# Patient Record
Sex: Female | Born: 1955
Health system: Southern US, Community
[De-identification: ages and names within clinical notes are randomized; demographics above are authoritative.]

## PROBLEM LIST (undated history)

## (undated) DIAGNOSIS — I10 Essential (primary) hypertension: Secondary | ICD-10-CM

## (undated) HISTORY — DX: Essential (primary) hypertension: I10

## (undated) HISTORY — PX: NO PAST SURGERIES: SHX2092

---

## 2017-12-03 DIAGNOSIS — J309 Allergic rhinitis, unspecified: Secondary | ICD-10-CM | POA: Diagnosis not present

## 2017-12-03 DIAGNOSIS — R05 Cough: Secondary | ICD-10-CM | POA: Diagnosis not present

## 2017-12-03 DIAGNOSIS — J019 Acute sinusitis, unspecified: Secondary | ICD-10-CM | POA: Diagnosis not present

## 2017-12-03 DIAGNOSIS — I10 Essential (primary) hypertension: Secondary | ICD-10-CM | POA: Diagnosis not present

## 2018-04-01 DIAGNOSIS — G4700 Insomnia, unspecified: Secondary | ICD-10-CM | POA: Diagnosis not present

## 2018-04-01 DIAGNOSIS — I10 Essential (primary) hypertension: Secondary | ICD-10-CM | POA: Diagnosis not present

## 2018-04-01 DIAGNOSIS — R350 Frequency of micturition: Secondary | ICD-10-CM | POA: Diagnosis not present

## 2018-04-01 DIAGNOSIS — K219 Gastro-esophageal reflux disease without esophagitis: Secondary | ICD-10-CM | POA: Diagnosis not present

## 2018-05-02 ENCOUNTER — Ambulatory Visit (INDEPENDENT_AMBULATORY_CARE_PROVIDER_SITE_OTHER): Payer: BLUE CROSS/BLUE SHIELD

## 2018-05-02 ENCOUNTER — Ambulatory Visit (INDEPENDENT_AMBULATORY_CARE_PROVIDER_SITE_OTHER): Payer: BLUE CROSS/BLUE SHIELD | Admitting: Sports Medicine

## 2018-05-02 ENCOUNTER — Encounter: Payer: Self-pay | Admitting: Sports Medicine

## 2018-05-02 DIAGNOSIS — M1611 Unilateral primary osteoarthritis, right hip: Secondary | ICD-10-CM | POA: Insufficient documentation

## 2018-05-02 DIAGNOSIS — R0781 Pleurodynia: Secondary | ICD-10-CM

## 2018-05-02 DIAGNOSIS — S20212A Contusion of left front wall of thorax, initial encounter: Secondary | ICD-10-CM

## 2018-05-02 DIAGNOSIS — M533 Sacrococcygeal disorders, not elsewhere classified: Secondary | ICD-10-CM | POA: Diagnosis not present

## 2018-05-02 DIAGNOSIS — S322XXA Fracture of coccyx, initial encounter for closed fracture: Secondary | ICD-10-CM

## 2018-05-02 DIAGNOSIS — S299XXA Unspecified injury of thorax, initial encounter: Secondary | ICD-10-CM | POA: Diagnosis not present

## 2018-05-02 DIAGNOSIS — M25551 Pain in right hip: Secondary | ICD-10-CM

## 2018-05-02 DIAGNOSIS — S79911A Unspecified injury of right hip, initial encounter: Secondary | ICD-10-CM | POA: Diagnosis not present

## 2018-05-02 DIAGNOSIS — M170 Bilateral primary osteoarthritis of knee: Secondary | ICD-10-CM

## 2018-05-02 DIAGNOSIS — S8991XA Unspecified injury of right lower leg, initial encounter: Secondary | ICD-10-CM | POA: Diagnosis not present

## 2018-05-02 DIAGNOSIS — W19XXXA Unspecified fall, initial encounter: Secondary | ICD-10-CM

## 2018-05-02 DIAGNOSIS — S3992XA Unspecified injury of lower back, initial encounter: Secondary | ICD-10-CM | POA: Diagnosis not present

## 2018-05-02 DIAGNOSIS — S298XXA Other specified injuries of thorax, initial encounter: Secondary | ICD-10-CM | POA: Insufficient documentation

## 2018-05-02 DIAGNOSIS — M25561 Pain in right knee: Secondary | ICD-10-CM | POA: Diagnosis not present

## 2018-05-02 DIAGNOSIS — S8992XA Unspecified injury of left lower leg, initial encounter: Secondary | ICD-10-CM | POA: Diagnosis not present

## 2018-05-02 MED ORDER — ACETAMINOPHEN ER 650 MG PO TBCR: 650.0000 mg | EXTENDED_RELEASE_TABLET | Freq: Three times a day (TID) | ORAL | 3 refills | Status: AC | PRN

## 2018-05-02 MED ORDER — TRAMADOL HCL 50 MG PO TABS: 50.0000 mg | ORAL_TABLET | Freq: Three times a day (TID) | ORAL | 0 refills | Status: DC | PRN

## 2018-05-02 NOTE — Progress Notes (Signed)
Subjective:    I'm seeing this patient as a consultation for: Dr. Secundino Ginger  CC: Fall  HPI: This is a pleasant 62 year old female, 2 weeks ago she was in [place] visiting her son and daughter, unfortunately while getting up to use the bathroom night she took a fall, fell into the couch injuring the left side of her chest wall, as well as her tailbone.  [place] and told that she had a coccygeal fracture.  She also has pain in her right knee, right groin, no shortness of breath, no hemoptysis, only minimal pain with deep breathing.  I reviewed the past medical history, family history, social history, surgical history, and allergies today and no changes were needed.  Please see the problem list section below in epic for further details.  Past Medical History: Past Medical History:  Diagnosis Date  . Hypertension    Past Surgical History: History reviewed. No pertinent surgical history. Social History: Social History   Socioeconomic History  . Marital status: Married    Spouse name: Not on file  . Number of children: Not on file  . Years of education: Not on file  . Highest education level: Not on file  Occupational History  . Not on file  Social Needs  . Financial resource strain: Not on file  . Food insecurity:    Worry: Not on file    Inability: Not on file  . Transportation needs:    Medical: Not on file    Non-medical: Not on file  Tobacco Use  . Smoking status: Never Smoker  . Smokeless tobacco: Never Used  Substance and Sexual Activity  . Alcohol use: Never    Frequency: Never  . Drug use: Never  . Sexual activity: Not on file  Lifestyle  . Physical activity:    Days per week: Not on file    Minutes per session: Not on file  . Stress: Not on file  Relationships  . Social connections:    Talks on phone: Not on file    Gets together: Not on file    Attends religious service: Not on file    Active member of club  or organization: Not on file    Attends meetings of clubs or organizations: Not on file    Relationship status: Not on file  Other Topics Concern  . Not on file  Social History Narrative  . Not on file   Family History: No family history on file. Allergies: No Known Allergies Medications: See med rec.  Review of Systems: No headache, visual changes, nausea, vomiting, diarrhea, constipation, dizziness, abdominal pain, skin rash, fevers, chills, night sweats, weight loss, swollen lymph nodes, body aches, joint swelling, muscle aches, chest pain, shortness of breath, mood changes, visual or auditory hallucinations.   Objective:   General: Well Developed, well nourished, and in no acute distress.  Neuro:  Extra-ocular muscles intact, able to move all 4 extremities, sensation grossly intact.  Deep tendon reflexes tested were normal. Psych: Alert and oriented, mood congruent with affect. ENT:  Ears and nose appear unremarkable.  Hearing grossly normal. Neck: Unremarkable overall appearance, trachea midline.  No visible thyroid enlargement. Eyes: Conjunctivae and lids appear unremarkable.  Pupils equal and round. Skin: Warm and dry, no rashes noted.  Cardiovascular: Pulses palpable, no extremity edema. Musculoskeletal: Tender to palpation of the tip of the coccyx, pain with internal rotation of the right hip, tender to palpation on the  medial right knee, minimal tenderness over the posterior axillary line of the left rib cage.  X-rays personally reviewed, she does have a nondisplaced distal coccygeal fracture, severe bilateral right worse than left hip osteoarthritis, bilateral right worse than left knee osteoarthritis with medial compartment space loss, there is an abnormal angle in her left rib at the BB marker, I am unclear as to whether this represents an actual fracture.  Impression and Recommendations:   This case required medical decision making of moderate complexity.  Closed  fracture of coccyx (HCC) 2 weeks ago, nondisplaced. She will try various pillows to take pressure off, arthritis strength Tylenol 3 times daily and tramadol for breakthrough pain. Suspect 4 more weeks for healing.  Bruised rib, left, initial encounter Analgesics. No sign of pneumothorax or obvious rib fracture.  Primary osteoarthritis of right hip We will start with conservative treatment, after her fracture is healed we will consider injections.  Primary osteoarthritis of both knees Right worse than left, we can start with conservative treatment, when her fractures are healed we will consider injectable treatments.  ___________________________________________ Ihor Austin. Benjamin Stain, M.D., ABFM., CAQSM. Primary Care and Sports Medicine [facility]  Adjunct Professor of Family Medicine  [employer]

## 2018-05-02 NOTE — Assessment & Plan Note (Signed)
2 weeks ago, nondisplaced. She will try various pillows to take pressure off, arthritis strength Tylenol 3 times daily and tramadol for breakthrough pain. Suspect 4 more weeks for healing.

## 2018-05-02 NOTE — Assessment & Plan Note (Signed)
Right worse than left, we can start with conservative treatment, when her fractures are healed we will consider injectable treatments.

## 2018-05-02 NOTE — Assessment & Plan Note (Signed)
Analgesics. No sign of pneumothorax or obvious rib fracture.

## 2018-05-02 NOTE — Assessment & Plan Note (Signed)
We will start with conservative treatment, after her fracture is healed we will consider injections.

## 2018-05-30 ENCOUNTER — Ambulatory Visit (INDEPENDENT_AMBULATORY_CARE_PROVIDER_SITE_OTHER): Payer: BLUE CROSS/BLUE SHIELD | Admitting: Sports Medicine

## 2018-05-30 ENCOUNTER — Encounter: Payer: Self-pay | Admitting: Sports Medicine

## 2018-05-30 DIAGNOSIS — M170 Bilateral primary osteoarthritis of knee: Secondary | ICD-10-CM | POA: Diagnosis not present

## 2018-05-30 DIAGNOSIS — S322XXA Fracture of coccyx, initial encounter for closed fracture: Secondary | ICD-10-CM | POA: Diagnosis not present

## 2018-05-30 NOTE — Assessment & Plan Note (Signed)
Right knee injection as above. Return as needed.

## 2018-05-30 NOTE — Assessment & Plan Note (Signed)
Approximately 6 weeks post nondisplaced coccygeal fracture. Continuing to improve.

## 2018-05-30 NOTE — Progress Notes (Signed)
Subjective:    CC: Follow-up  HPI: This is a pleasant 62 year old female, I saw her 4 weeks ago, 2 weeks after a fall where she sustained a nondisplaced coccygeal fracture, she also has severe bilateral hip and knee osteoarthritis, hips and left knee are doing okay, her right knee has been persistently painful.  Localized to the medial joint line without radiation, no mechanical symptoms.  Her coccygeal pain is about 70 to 80% better and continuing to improve.  Still has some difficulty sitting when leaning back, but does well with a pillow under her buttocks.  I reviewed the past medical history, family history, social history, surgical history, and allergies today and no changes were needed.  Please see the problem list section below in epic for further details.  Past Medical History: Past Medical History:  Diagnosis Date  . Hypertension    Past Surgical History: Past Surgical History:  Procedure Laterality Date  . NO PAST SURGERIES     Social History: Social History   Socioeconomic History  . Marital status: Married    Spouse name: Not on file  . Number of children: Not on file  . Years of education: Not on file  . Highest education level: Not on file  Occupational History  . Not on file  Social Needs  . Financial resource strain: Not on file  . Food insecurity:    Worry: Not on file    Inability: Not on file  . Transportation needs:    Medical: Not on file    Non-medical: Not on file  Tobacco Use  . Smoking status: Never Smoker  . Smokeless tobacco: Never Used  Substance and Sexual Activity  . Alcohol use: Never    Frequency: Never  . Drug use: Never  . Sexual activity: Not on file  Lifestyle  . Physical activity:    Days per week: Not on file    Minutes per session: Not on file  . Stress: Not on file  Relationships  . Social connections:    Talks on phone: Not on file    Gets together: Not on file    Attends religious service: Not on file    Active  member of club or organization: Not on file    Attends meetings of clubs or organizations: Not on file    Relationship status: Not on file  Other Topics Concern  . Not on file  Social History Narrative  . Not on file   Family History: No family history on file. Allergies: No Known Allergies Medications: See med rec.  Review of Systems: No fevers, chills, night sweats, weight loss, chest pain, or shortness of breath.   Objective:    General: Well Developed, well nourished, and in no acute distress.  Neuro: Alert and oriented x3, extra-ocular muscles intact, sensation grossly intact.  HEENT: Normocephalic, atraumatic, pupils equal round reactive to light, neck supple, no masses, no lymphadenopathy, thyroid nonpalpable.  Skin: Warm and dry, no rashes. Cardiac: Regular rate and rhythm, no murmurs rubs or gallops, no lower extremity edema.  Respiratory: Clear to auscultation bilaterally. Not using accessory muscles, speaking in full sentences. Right knee: Normal to inspection with no erythema or effusion or obvious bony abnormalities. Tender to palpation of the medial joint line ROM normal in flexion and extension and lower leg rotation. Ligaments with solid consistent endpoints including ACL, PCL, LCL, MCL. Negative Mcmurray's and provocative meniscal tests. Non painful patellar compression. Patellar and quadriceps tendons unremarkable. Hamstring and quadriceps strength is  normal.  Procedure: Real-time Ultrasound Guided Injection of right knee Device: GE Logiq E  Verbal informed consent obtained.  Time-out conducted.  Noted no overlying erythema, induration, or other signs of local infection.  Skin prepped in a sterile fashion.  Local anesthesia: Topical Ethyl chloride.  With sterile technique and under real time ultrasound guidance: 1 cc Kenalog 40, 2 cc lidocaine, 2 cc bupivacaine injected easily Completed without difficulty  Pain immediately resolved suggesting accurate  placement of the medication.  Advised to call if fevers/chills, erythema, induration, drainage, or persistent bleeding.  Images permanently stored and available for review in the ultrasound unit.  Impression: Technically successful ultrasound guided injection.  Impression and Recommendations:    Primary osteoarthritis of both knees Right knee injection as above. Return as needed.  Closed fracture of coccyx (HCC) Approximately 6 weeks post nondisplaced coccygeal fracture. Continuing to improve. ___________________________________________ Ihor Austin. Benjamin Stain, M.D., ABFM., CAQSM. Primary Care and Sports Medicine [facility]  Adjunct Professor of Family Medicine  [employer]

## 2018-06-21 DIAGNOSIS — K219 Gastro-esophageal reflux disease without esophagitis: Secondary | ICD-10-CM | POA: Diagnosis not present

## 2018-06-21 DIAGNOSIS — Z0000 Encounter for general adult medical examination without abnormal findings: Secondary | ICD-10-CM | POA: Diagnosis not present

## 2018-06-21 DIAGNOSIS — I10 Essential (primary) hypertension: Secondary | ICD-10-CM | POA: Diagnosis not present

## 2018-06-21 DIAGNOSIS — R350 Frequency of micturition: Secondary | ICD-10-CM | POA: Diagnosis not present

## 2018-06-27 ENCOUNTER — Encounter: Payer: Self-pay | Admitting: Sports Medicine

## 2018-06-27 ENCOUNTER — Ambulatory Visit (INDEPENDENT_AMBULATORY_CARE_PROVIDER_SITE_OTHER): Payer: Self-pay | Admitting: Sports Medicine

## 2018-06-27 DIAGNOSIS — M170 Bilateral primary osteoarthritis of knee: Secondary | ICD-10-CM

## 2018-06-27 NOTE — Assessment & Plan Note (Signed)
Pain for the most part resolved, return as needed, we gave her the information on Visco supplementation and we can do this if she decides. I am going to get her approved but we aren't going to schedule her yet.

## 2018-06-27 NOTE — Progress Notes (Signed)
  Subjective:    CC: Follow-up  HPI: Jaydie returns, we injected her knee at the last visit, for the most part feels pretty good.  I reviewed the past medical history, family history, social history, surgical history, and allergies today and no changes were needed.  Please see the problem list section below in epic for further details.  Past Medical History: Past Medical History:  Diagnosis Date  . Hypertension    Past Surgical History: Past Surgical History:  Procedure Laterality Date  . NO PAST SURGERIES     Social History: Social History   Socioeconomic History  . Marital status: Married    Spouse name: Not on file  . Number of children: Not on file  . Years of education: Not on file  . Highest education level: Not on file  Occupational History  . Not on file  Social Needs  . Financial resource strain: Not on file  . Food insecurity:    Worry: Not on file    Inability: Not on file  . Transportation needs:    Medical: Not on file    Non-medical: Not on file  Tobacco Use  . Smoking status: Never Smoker  . Smokeless tobacco: Never Used  Substance and Sexual Activity  . Alcohol use: Never    Frequency: Never  . Drug use: Never  . Sexual activity: Not on file  Lifestyle  . Physical activity:    Days per week: Not on file    Minutes per session: Not on file  . Stress: Not on file  Relationships  . Social connections:    Talks on phone: Not on file    Gets together: Not on file    Attends religious service: Not on file    Active member of club or organization: Not on file    Attends meetings of clubs or organizations: Not on file    Relationship status: Not on file  Other Topics Concern  . Not on file  Social History Narrative  . Not on file   Family History: No family history on file. Allergies: No Known Allergies Medications: See med rec.  Review of Systems: No fevers, chills, night sweats, weight loss, chest pain, or shortness of breath.    Objective:    General: Well Developed, well nourished, and in no acute distress.  Neuro: Alert and oriented x3, extra-ocular muscles intact, sensation grossly intact.  HEENT: Normocephalic, atraumatic, pupils equal round reactive to light, neck supple, no masses, no lymphadenopathy, thyroid nonpalpable.  Skin: Warm and dry, no rashes. Cardiac: Regular rate and rhythm, no murmurs rubs or gallops, no lower extremity edema.  Respiratory: Clear to auscultation bilaterally. Not using accessory muscles, speaking in full sentences.  Impression and Recommendations:    Primary osteoarthritis of both knees Pain for the most part resolved, return as needed, we gave her the information on Visco supplementation and we can do this if she decides. I am going to get her approved but we aren't going to schedule her yet.  ___________________________________________ Ihor Austin. Benjamin Stain, M.D., ABFM., CAQSM. Primary Care and Sports Medicine [facility]  Adjunct Professor of Family Medicine  [employer]

## 2018-07-01 ENCOUNTER — Telehealth: Payer: Self-pay | Admitting: Sports Medicine

## 2018-07-01 NOTE — Telephone Encounter (Signed)
-----   Message from Neldon Labella, CMA sent at 06/27/2018  2:19 PM EST ----- Received a message that patients insurance is inactive. Waiting on manual benefit from [employer]. ----- Message ----- From: Monica Becton, MD Sent: 06/27/2018  12:25 PM EST To: Neldon Labella, CMA  Bilateral [employer] approval please. ___________________________________________Thomas J. Benjamin Stain, M.D., ABFM., CAQSM.[facility] Medicine[facility] KernersvilleAdjunct Professor of Family Medicine [employer] [facility] [employer]

## 2018-07-01 NOTE — Telephone Encounter (Signed)
[employer] is not covered because the patient does not have active coverage. Call reference number [id number]. No additional steps needed for the clinic. Left message for patient that [employer] is not covered by her insurance. Patient was asked to call back with questions.

## 2019-03-26 DIAGNOSIS — Z13220 Encounter for screening for lipoid disorders: Secondary | ICD-10-CM | POA: Diagnosis not present

## 2019-03-26 DIAGNOSIS — Z0000 Encounter for general adult medical examination without abnormal findings: Secondary | ICD-10-CM | POA: Diagnosis not present

## 2019-03-26 DIAGNOSIS — M2550 Pain in unspecified joint: Secondary | ICD-10-CM | POA: Diagnosis not present

## 2019-03-26 DIAGNOSIS — R7301 Impaired fasting glucose: Secondary | ICD-10-CM | POA: Diagnosis not present

## 2019-03-26 DIAGNOSIS — Z23 Encounter for immunization: Secondary | ICD-10-CM | POA: Diagnosis not present

## 2019-03-26 DIAGNOSIS — I10 Essential (primary) hypertension: Secondary | ICD-10-CM | POA: Diagnosis not present

## 2019-03-28 ENCOUNTER — Encounter: Payer: Self-pay | Admitting: Sports Medicine

## 2019-03-28 ENCOUNTER — Other Ambulatory Visit: Payer: Self-pay

## 2019-03-28 ENCOUNTER — Ambulatory Visit (INDEPENDENT_AMBULATORY_CARE_PROVIDER_SITE_OTHER): Payer: BC Managed Care – PPO | Admitting: Sports Medicine

## 2019-03-28 DIAGNOSIS — S322XXA Fracture of coccyx, initial encounter for closed fracture: Secondary | ICD-10-CM

## 2019-03-28 DIAGNOSIS — W19XXXA Unspecified fall, initial encounter: Secondary | ICD-10-CM | POA: Diagnosis not present

## 2019-03-28 DIAGNOSIS — M170 Bilateral primary osteoarthritis of knee: Secondary | ICD-10-CM

## 2019-03-28 DIAGNOSIS — M1611 Unilateral primary osteoarthritis, right hip: Secondary | ICD-10-CM | POA: Diagnosis not present

## 2019-03-28 MED ORDER — TRAMADOL HCL 50 MG PO TABS: 50.0000 mg | ORAL_TABLET | Freq: Three times a day (TID) | ORAL | 0 refills | Status: DC | PRN

## 2019-03-28 NOTE — Assessment & Plan Note (Signed)
Right knee injection today, previous injection was in December 2019. She did get a bit of a steroid flushing, she did not get the standard steroid flare in the knee. We can always inject her other knee at a follow-up visit, we can also get her approved for Visco supplementation. Adding tramadol for as needed use.

## 2019-03-28 NOTE — Progress Notes (Addendum)
Subjective:    CC: Bilateral knee pain  HPI: This is a very pleasant 63 year old female with known osteoarthritis, we last injected her knee in December 2019, she had some flushing from the steroid but otherwise things went really well.  Now she is having recurrence of pain, moderate, persistent, localized to the medial joint line of both knees, right worse than left without radiation.  No trauma, no mechanical symptoms.  I reviewed the past medical history, family history, social history, surgical history, and allergies today and no changes were needed.  Please see the problem list section below in epic for further details.  Past Medical History: Past Medical History:  Diagnosis Date  . Hypertension    Past Surgical History: Past Surgical History:  Procedure Laterality Date  . NO PAST SURGERIES     Social History: Social History   Socioeconomic History  . Marital status: Married    Spouse name: Not on file  . Number of children: Not on file  . Years of education: Not on file  . Highest education level: Not on file  Occupational History  . Not on file  Social Needs  . Financial resource strain: Not on file  . Food insecurity    Worry: Not on file    Inability: Not on file  . Transportation needs    Medical: Not on file    Non-medical: Not on file  Tobacco Use  . Smoking status: Never Smoker  . Smokeless tobacco: Never Used  Substance and Sexual Activity  . Alcohol use: Never    Frequency: Never  . Drug use: Never  . Sexual activity: Not on file  Lifestyle  . Physical activity    Days per week: Not on file    Minutes per session: Not on file  . Stress: Not on file  Relationships  . Social Musician on phone: Not on file    Gets together: Not on file    Attends religious service: Not on file    Active member of club or organization: Not on file    Attends meetings of clubs or organizations: Not on file    Relationship status: Not on file  Other  Topics Concern  . Not on file  Social History Narrative  . Not on file   Family History: No family history on file. Allergies: No Known Allergies Medications: See med rec.  Review of Systems: No fevers, chills, night sweats, weight loss, chest pain, or shortness of breath.   Objective:    General: Well Developed, well nourished, and in no acute distress.  Neuro: Alert and oriented x3, extra-ocular muscles intact, sensation grossly intact.  HEENT: Normocephalic, atraumatic, pupils equal round reactive to light, neck supple, no masses, no lymphadenopathy, thyroid nonpalpable.  Skin: Warm and dry, no rashes. Cardiac: Regular rate and rhythm, no murmurs rubs or gallops, no lower extremity edema.  Respiratory: Clear to auscultation bilaterally. Not using accessory muscles, speaking in full sentences. Right knee: Normal to inspection with no erythema or effusion or obvious bony abnormalities. Tender at the medial joint line ROM normal in flexion and extension and lower leg rotation. Ligaments with solid consistent endpoints including ACL, PCL, LCL, MCL. Negative Mcmurray's and provocative meniscal tests. Non painful patellar compression. Patellar and quadriceps tendons unremarkable. Hamstring and quadriceps strength is normal.  Procedure: Real-time Ultrasound Guided injection of the right knee Device: GE Logiq E  Verbal informed consent obtained.  Time-out conducted.  Noted no overlying erythema, induration,  or other signs of local infection.  Skin prepped in a sterile fashion.  Local anesthesia: Topical Ethyl chloride.  With sterile technique and under real time ultrasound guidance:  1 cc Kenalog 40, 2 cc lidocaine, 2 cc bupivacaine injected easily Completed without difficulty  Pain immediately resolved suggesting accurate placement of the medication.  Advised to call if fevers/chills, erythema, induration, drainage, or persistent bleeding.  Images permanently stored and  available for review in the ultrasound unit.  Impression: Technically successful ultrasound guided injection.  Impression and Recommendations:    Primary osteoarthritis of both knees Right knee injection today, previous injection was in December 2019. She did get a bit of a steroid flushing, she did not get the standard steroid flare in the knee. We can always inject her other knee at a follow-up visit, we can also get her approved for Visco supplementation. Adding tramadol for as needed use.  Primary osteoarthritis of right hip We have only treated this conservatively, when she returns we can consider an injection into her hip joint. We also discussed PRP which now has data for intra-articular use before considering arthroplasty.   ___________________________________________ Gwen Her. Dianah Field, M.D., ABFM., CAQSM. Primary Care and Sports Medicine [facility]  Adjunct Professor of [place] [employer] [facility] [employer]

## 2019-03-28 NOTE — Assessment & Plan Note (Signed)
We have only treated this conservatively, when she returns we can consider an injection into her hip joint. We also discussed PRP which now has data for intra-articular use before considering arthroplasty.

## 2019-04-28 ENCOUNTER — Encounter: Payer: Self-pay | Admitting: Sports Medicine

## 2019-04-28 ENCOUNTER — Other Ambulatory Visit: Payer: Self-pay

## 2019-04-28 ENCOUNTER — Ambulatory Visit (INDEPENDENT_AMBULATORY_CARE_PROVIDER_SITE_OTHER): Payer: BC Managed Care – PPO | Admitting: Sports Medicine

## 2019-04-28 DIAGNOSIS — M1611 Unilateral primary osteoarthritis, right hip: Secondary | ICD-10-CM | POA: Diagnosis not present

## 2019-04-28 DIAGNOSIS — M170 Bilateral primary osteoarthritis of knee: Secondary | ICD-10-CM | POA: Diagnosis not present

## 2019-04-28 DIAGNOSIS — S322XXA Fracture of coccyx, initial encounter for closed fracture: Secondary | ICD-10-CM | POA: Diagnosis not present

## 2019-04-28 DIAGNOSIS — W19XXXA Unspecified fall, initial encounter: Secondary | ICD-10-CM

## 2019-04-28 MED ORDER — TRAMADOL HCL 50 MG PO TABS: 50.0000 mg | ORAL_TABLET | Freq: Three times a day (TID) | ORAL | 2 refills | Status: AC | PRN

## 2019-04-28 NOTE — Assessment & Plan Note (Signed)
Right knee did well after injection at the last visit. Left knee is actually doing okay as well, we could certainly discuss viscosupplementation in the future but no further intervention needed other than tramadol.

## 2019-04-28 NOTE — Assessment & Plan Note (Signed)
Has done well with conservative treatment, tramadol has worked extremely well, refilling this. No injection just yet.

## 2019-04-28 NOTE — Progress Notes (Signed)
  Subjective:    CC: Follow-up  HPI: Hip and knee pain: Right knee pain is better after injection at the last visit, left knee is doing well, right hip is okay with tramadol.  I reviewed the past medical history, family history, social history, surgical history, and allergies today and no changes were needed.  Please see the problem list section below in epic for further details.  Past Medical History: Past Medical History:  Diagnosis Date  . Hypertension    Past Surgical History: Past Surgical History:  Procedure Laterality Date  . NO PAST SURGERIES     Social History: Social History   Socioeconomic History  . Marital status: Married    Spouse name: Not on file  . Number of children: Not on file  . Years of education: Not on file  . Highest education level: Not on file  Occupational History  . Not on file  Social Needs  . Financial resource strain: Not on file  . Food insecurity    Worry: Not on file    Inability: Not on file  . Transportation needs    Medical: Not on file    Non-medical: Not on file  Tobacco Use  . Smoking status: Never Smoker  . Smokeless tobacco: Never Used  Substance and Sexual Activity  . Alcohol use: Never    Frequency: Never  . Drug use: Never  . Sexual activity: Not on file  Lifestyle  . Physical activity    Days per week: Not on file    Minutes per session: Not on file  . Stress: Not on file  Relationships  . Social Herbalist on phone: Not on file    Gets together: Not on file    Attends religious service: Not on file    Active member of club or organization: Not on file    Attends meetings of clubs or organizations: Not on file    Relationship status: Not on file  Other Topics Concern  . Not on file  Social History Narrative  . Not on file   Family History: No family history on file. Allergies: No Known Allergies Medications: See med rec.  Review of Systems: No fevers, chills, night sweats, weight loss, chest  pain, or shortness of breath.   Objective:    General: Well Developed, well nourished, and in no acute distress.  Neuro: Alert and oriented x3, extra-ocular muscles intact, sensation grossly intact.  HEENT: Normocephalic, atraumatic, pupils equal round reactive to light, neck supple, no masses, no lymphadenopathy, thyroid nonpalpable.  Skin: Warm and dry, no rashes. Cardiac: Regular rate and rhythm, no murmurs rubs or gallops, no lower extremity edema.  Respiratory: Clear to auscultation bilaterally. Not using accessory muscles, speaking in full sentences.  Impression and Recommendations:    Primary osteoarthritis of right hip Has done well with conservative treatment, tramadol has worked extremely well, refilling this. No injection just yet.  Primary osteoarthritis of both knees Right knee did well after injection at the last visit. Left knee is actually doing okay as well, we could certainly discuss viscosupplementation in the future but no further intervention needed other than tramadol.   ___________________________________________ Gwen Her. Dianah Field, M.D., ABFM., CAQSM. Primary Care and Sports Medicine [facility]  Adjunct Professor of [place] [employer] [facility] [employer]

## 2019-04-29 ENCOUNTER — Telehealth: Payer: Self-pay | Admitting: Sports Medicine

## 2019-04-29 NOTE — Telephone Encounter (Signed)
Received fax for prior authorization on Tramadol 50 mg Tablets. Sent through cover my meds and received authorization.   Valid: 04/29/2019 - 04/28/2020 PA# [employer] [id number]  I will notify pharmacy - CF

## 2019-05-19 DIAGNOSIS — Z23 Encounter for immunization: Secondary | ICD-10-CM | POA: Diagnosis not present

## 2019-05-19 DIAGNOSIS — Z780 Asymptomatic menopausal state: Secondary | ICD-10-CM | POA: Diagnosis not present

## 2019-08-04 IMAGING — DX DG KNEE 1-2V*L*
3 series · 4 of 4 positions shown · non-contrast
Comparison: None

CLINICAL DATA: Fell 2 weeks ago, pain at RIGHT knee, LEFT ribs,
RIGHT hip, and sacrum/coccyx

EXAM:
RIGHT KNEE - COMPLETE 4+ VIEW; LEFT KNEE - 1-2 VIEW

[tunnel]
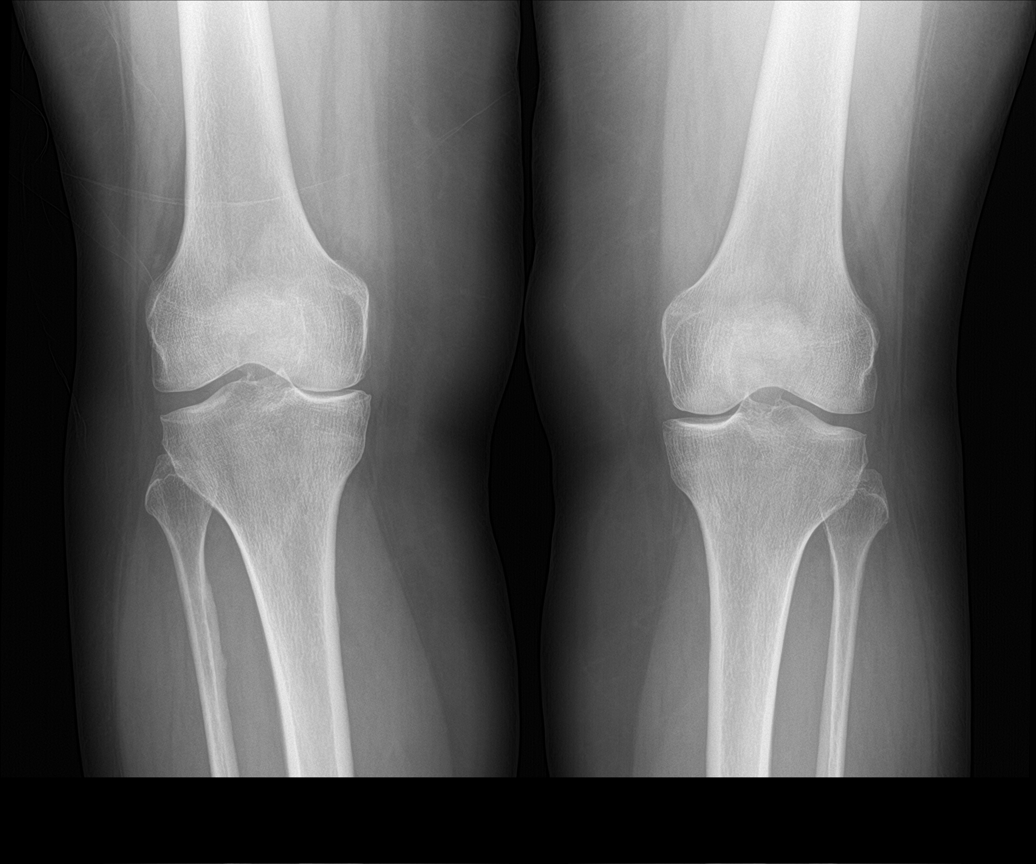

[knee lat]
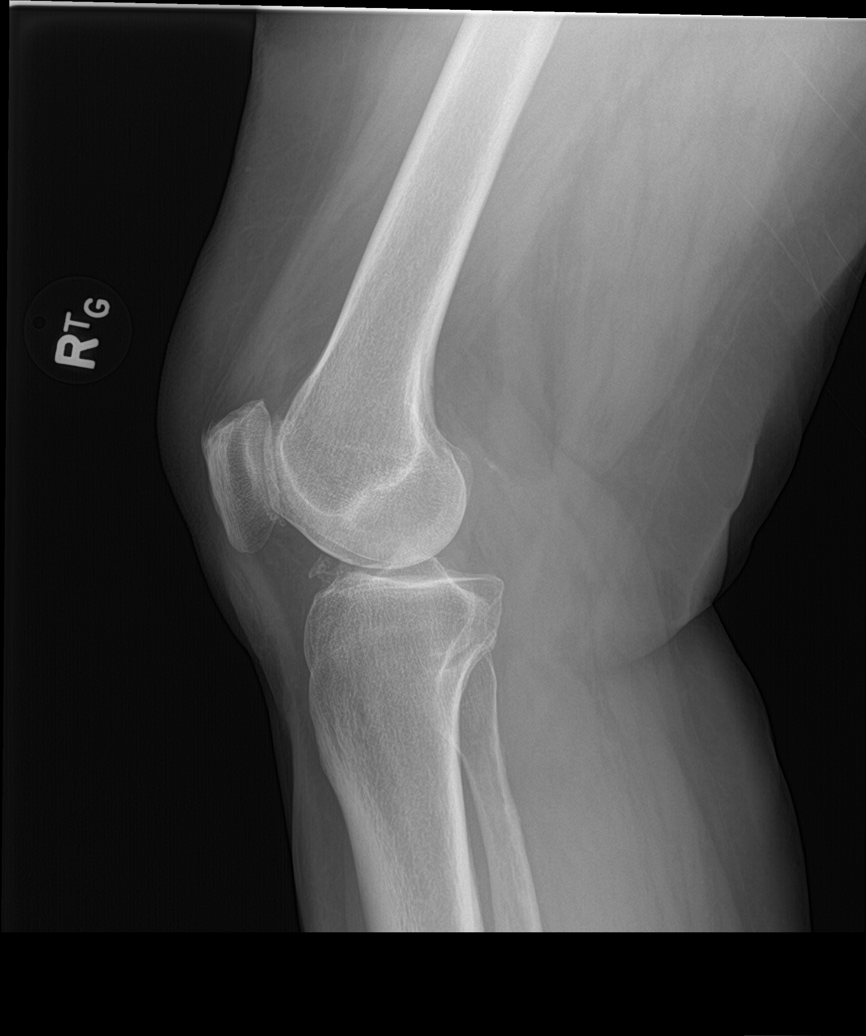

[Series 4: knee sunrise · 0.14mm/px · 2 of 2 slices shown]
[im 1/2]
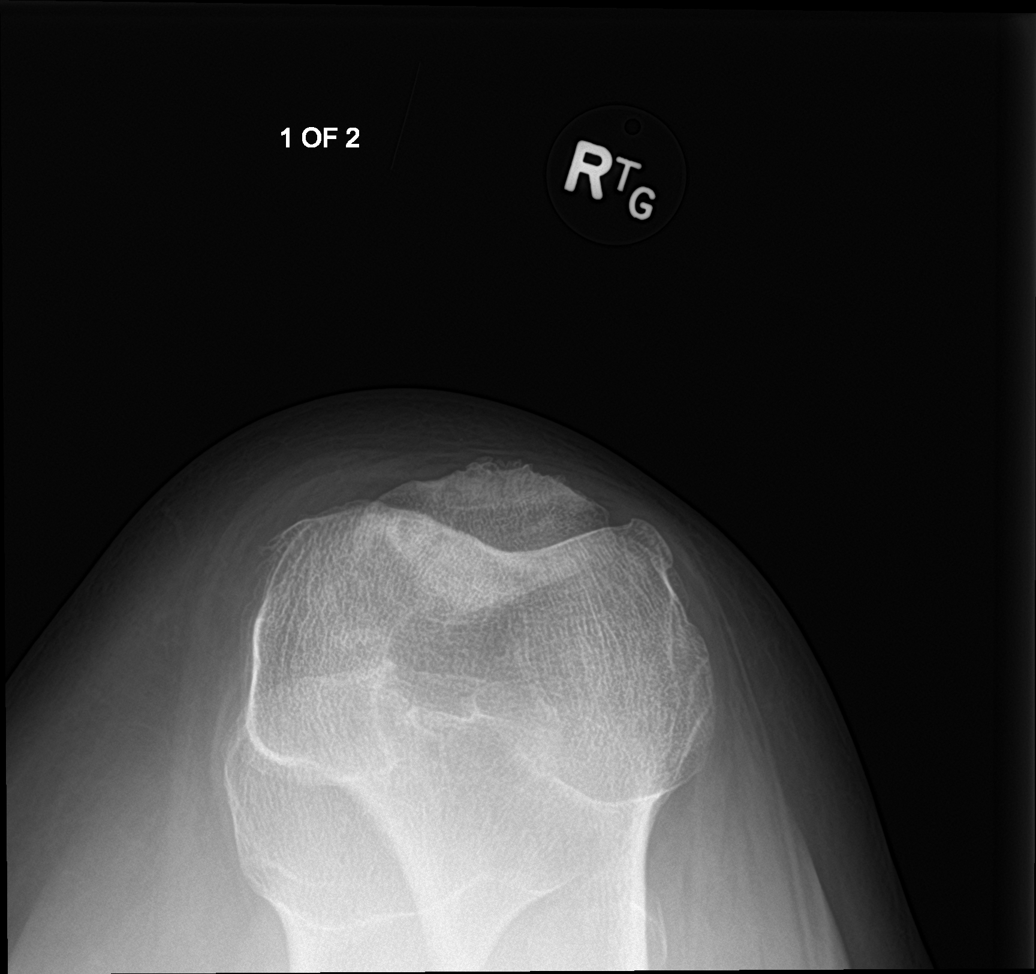
[im 2/2]
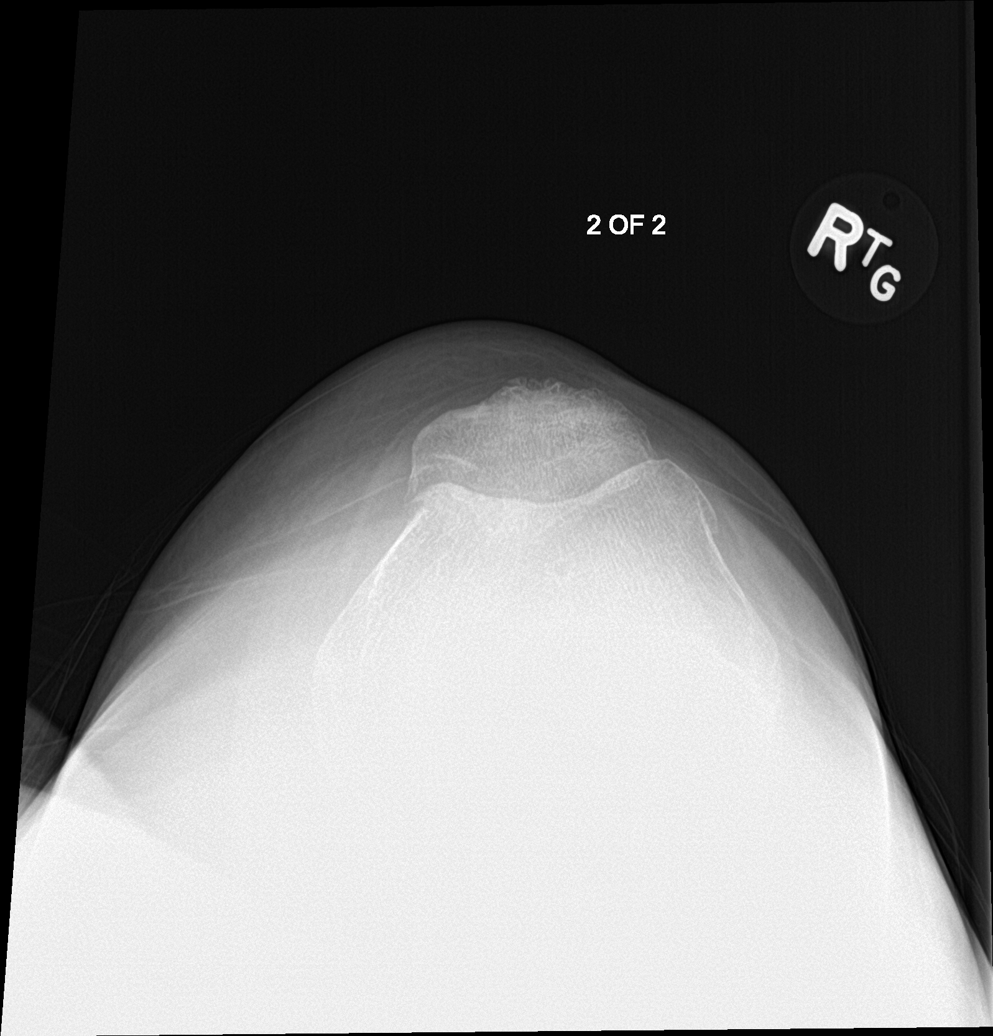

[4 of 4 positions shown; findings below may reference images not displayed]

FINDINGS: RIGHT knee:

Mild osseous demineralization.

Joint space narrowing at medial compartment and patellofemoral
joint.

Small spurs at medial margin of medial compartment.

No acute fracture, dislocation, or bone destruction.

No knee joint effusion.

LEFT knee:

Osseous demineralization.

Medial compartment joint space narrowing.

No obvious fracture or dislocation on AP and tunnel views; no
lateral view.
IMPRESSION: Degenerative changes of both knees.

No acute abnormalities.
